# Patient Record
Sex: Female | Born: 2000 | Race: White | Hispanic: No | Marital: Single | Smoking: Never smoker
Health system: Southern US, Community
[De-identification: ages and names within clinical notes are randomized; demographics above are authoritative.]

## PROBLEM LIST (undated history)

## (undated) DIAGNOSIS — J45909 Unspecified asthma, uncomplicated: Secondary | ICD-10-CM

## (undated) DIAGNOSIS — G43909 Migraine, unspecified, not intractable, without status migrainosus: Secondary | ICD-10-CM

## (undated) DIAGNOSIS — M1990 Unspecified osteoarthritis, unspecified site: Secondary | ICD-10-CM

---

## 2019-02-22 ENCOUNTER — Other Ambulatory Visit: Payer: Self-pay

## 2019-02-22 ENCOUNTER — Encounter: Payer: Self-pay | Admitting: Emergency Medicine

## 2019-02-22 ENCOUNTER — Other Ambulatory Visit: Payer: Self-pay | Admitting: *Deleted

## 2019-02-22 ENCOUNTER — Emergency Department
Admission: EM | Admit: 2019-02-22 | Discharge: 2019-02-22 | Disposition: A | Discharge status: Home / Self Care | Payer: BC Managed Care – PPO | Attending: Emergency Medicine | Admitting: Emergency Medicine

## 2019-02-22 ENCOUNTER — Emergency Department: Payer: BC Managed Care – PPO

## 2019-02-22 DIAGNOSIS — B2790 Infectious mononucleosis, unspecified without complication: Secondary | ICD-10-CM | POA: Diagnosis not present

## 2019-02-22 DIAGNOSIS — R070 Pain in throat: Secondary | ICD-10-CM | POA: Diagnosis not present

## 2019-02-22 DIAGNOSIS — Z20822 Contact with and (suspected) exposure to covid-19: Secondary | ICD-10-CM

## 2019-02-22 DIAGNOSIS — R509 Fever, unspecified: Secondary | ICD-10-CM | POA: Diagnosis present

## 2019-02-22 HISTORY — DX: Unspecified osteoarthritis, unspecified site: M19.90

## 2019-02-22 HISTORY — DX: Migraine, unspecified, not intractable, without status migrainosus: G43.909

## 2019-02-22 LAB — GROUP A STREP BY PCR: Group A Strep by PCR: NOT DETECTED

## 2019-02-22 LAB — MONONUCLEOSIS SCREEN: Mono Screen: POSITIVE — AB

## 2019-02-22 NOTE — ED Provider Notes (Signed)
[facility] Emergency Department Provider Note  ____________________________________________   First MD Initiated Contact with Patient 02/22/19 1250     (approximate)  I have reviewed the triage vital signs and the nursing notes.   HISTORY  Chief Complaint Fever and Sore Throat   HPI Carol Parrish is a 18 y.o. female presents to the ED with complaint of fever and sore throat that started yesterday.  Patient states that she was having a Covid swab done at the drive-through here at the ED and they advised her to come to the ED to be evaluated.  Patient states she is unaware of any direct contact with Covid but knows people who has friends who are positive.  Patient denies any difficulty breathing, changes in taste or smell or GI symptoms.  She rates her pain as an 8 out of 10.     Past Medical History:  Diagnosis Date  . Arthritis   . Migraines     There are no active problems to display for this patient.   History reviewed. No pertinent surgical history.  Prior to Admission medications   Not on File    Allergies Amoxicillin  No family history on file.  Social History Social History   Tobacco Use  . Smoking status: Never Smoker  . Smokeless tobacco: Never Used  Substance Use Topics  . Alcohol use: Yes    Comment: occasional  . Drug use: Never    Review of Systems Constitutional: Positive fever/chills Eyes: No visual changes. ENT: Positive sore throat. Cardiovascular: Denies chest pain. Respiratory: Denies shortness of breath. Gastrointestinal: No abdominal pain.  No nausea, no vomiting. Musculoskeletal: Negative for muscle aches. Skin: Negative for rash. Neurological: Negative for headaches, focal weakness or numbness. ____________________________________________   PHYSICAL EXAM:  VITAL SIGNS: ED Triage Vitals  Enc Vitals Group     BP 02/22/19 1232 131/71     Pulse Rate 02/22/19 1232 (!) 139     Resp 02/22/19 1232 17   Temp 02/22/19 1232 (!) 100.9 F (38.3 C)     Temp Source 02/22/19 1232 Oral     SpO2 02/22/19 1232 99 %     Weight 02/22/19 1233 130 lb (59 kg)     Height 02/22/19 1233 5\' 5"  (1.651 m)     Head Circumference --      Peak Flow --      Pain Score 02/22/19 1233 8     Pain Loc --      Pain Edu? --      Excl. in GC? --    Constitutional: Alert and oriented. Well appearing and in no acute distress. Eyes: Conjunctivae are normal.  Head: Atraumatic. Nose: No congestion/rhinnorhea. Mouth/Throat: Mucous membranes are moist.  Oropharynx non-erythematous.  Uvula is midline.  No exudate was noted. Neck: No stridor.   Hematological/Lymphatic/Immunilogical: Bilateral tender cervical lymphadenopathy. Cardiovascular: Normal rate, regular rhythm. Grossly normal heart sounds.  Good peripheral circulation. Respiratory: Normal respiratory effort.  No retractions. Lungs CTAB. Gastrointestinal: Soft and nontender. No distention. No abdominal bruits. No CVA tenderness. Musculoskeletal: Moves upper and lower extremities without difficulty. Neurologic:  Normal speech and language. No gross focal neurologic deficits are appreciated. No gait instability. Skin:  Skin is warm, dry and intact. No rash noted. Psychiatric: Mood and affect are normal. Speech and behavior are normal.  ____________________________________________   LABS (all labs ordered are listed, but only abnormal results are displayed)  Labs Reviewed  MONONUCLEOSIS SCREEN - Abnormal; Notable for the following components:  Result Value   Mono Screen POSITIVE (*)    All other components within normal limits  GROUP A STREP BY PCR    RADIOLOGY   Official radiology report(s): Dg Chest Portable 1 View  Result Date: 02/22/2019 CLINICAL DATA:  Cough and fever for several days EXAM: PORTABLE CHEST 1 VIEW COMPARISON:  None. FINDINGS: The heart size and mediastinal contours are within normal limits. Both lungs are clear. The visualized  skeletal structures are unremarkable. IMPRESSION: No active disease. Electronically Signed   By: Inez Catalina M.D.   On: 02/22/2019 13:46    ____________________________________________   PROCEDURES  Procedure(s) performed (including Critical Care):  Procedures   ____________________________________________   INITIAL IMPRESSION / ASSESSMENT AND PLAN / ED COURSE  As part of my medical decision making, I reviewed the following data within the electronic MEDICAL RECORD NUMBER Notes from prior ED visits and [place] Controlled Substance Database  18 year old female presents to the ED with complaint of fever and sore throat that started yesterday.  Patient was at the [place] testing area here at the hospital run by the health department and it was suggested to her that she come to the ED to be evaluated.  Patient had no direct contact with anyone who was positive for Covid.  Throat was unremarkable however she did have moderate tender cervical lymphadenopathy.  Strep test was negative but mono test was positive.  Patient was also made aware that her chest x-ray was negative.  She was given information about mono and told that she should still quarantine until she has her results of her Covid test.  ____________________________________________   FINAL CLINICAL IMPRESSION(S) / ED DIAGNOSES  Final diagnoses:  Infectious mononucleosis without complication, infectious mononucleosis due to unspecified organism     ED Discharge Orders    None       Note:  This document was prepared using Dragon voice recognition software and may include unintentional dictation errors.    Johnn Hai, PA-C 02/22/19 1554    Carrie Mew, MD 02/22/19 [id number]

## 2019-02-22 NOTE — ED Triage Notes (Signed)
Patient reports fever and sore throat since yesterday afternoon. Was having covid swab done by health dept and they suggested she come to ED to be evaluated.

## 2019-02-22 NOTE — Discharge Instructions (Addendum)
Continue with Tylenol or ibuprofen as needed for fever.  Increase fluids.  Get plenty of rest.  Also you should quarantine until you have heard from your Covid test.  An additional 10 days will be needed to quarantine after you have received the results of your test if it is positive.  Dr.Archinal is the doctor at the [facility].  Contact information was listed on your discharge papers.

## 2019-02-24 LAB — NOVEL CORONAVIRUS, NAA: SARS-CoV-2, NAA: DETECTED — AB

## 2019-05-25 ENCOUNTER — Other Ambulatory Visit: Payer: Self-pay

## 2019-05-25 DIAGNOSIS — N6320 Unspecified lump in the left breast, unspecified quadrant: Secondary | ICD-10-CM

## 2019-06-07 ENCOUNTER — Ambulatory Visit
Admission: RE | Admit: 2019-06-07 | Discharge: 2019-06-07 | Disposition: A | Discharge status: Home / Self Care | Payer: BC Managed Care – PPO | Source: Ambulatory Visit

## 2019-06-07 DIAGNOSIS — N6320 Unspecified lump in the left breast, unspecified quadrant: Secondary | ICD-10-CM

## 2019-06-13 ENCOUNTER — Other Ambulatory Visit: Payer: Self-pay

## 2019-06-13 DIAGNOSIS — N6320 Unspecified lump in the left breast, unspecified quadrant: Secondary | ICD-10-CM

## 2019-12-18 ENCOUNTER — Other Ambulatory Visit: Payer: Self-pay

## 2019-12-18 ENCOUNTER — Ambulatory Visit
Admission: RE | Admit: 2019-12-18 | Discharge: 2019-12-18 | Disposition: A | Discharge status: Home / Self Care | Payer: BC Managed Care – PPO | Source: Ambulatory Visit

## 2019-12-18 DIAGNOSIS — N6320 Unspecified lump in the left breast, unspecified quadrant: Secondary | ICD-10-CM | POA: Diagnosis not present

## 2019-12-22 ENCOUNTER — Other Ambulatory Visit: Payer: BC Managed Care – PPO

## 2020-02-23 ENCOUNTER — Ambulatory Visit
Admission: EM | Admit: 2020-02-23 | Discharge: 2020-02-23 | Disposition: A | Discharge status: Home / Self Care | Payer: BC Managed Care – PPO | Attending: Family Medicine | Admitting: Family Medicine

## 2020-02-23 ENCOUNTER — Encounter: Payer: Self-pay | Admitting: Emergency Medicine

## 2020-02-23 ENCOUNTER — Other Ambulatory Visit: Payer: Self-pay

## 2020-02-23 DIAGNOSIS — R519 Headache, unspecified: Secondary | ICD-10-CM

## 2020-02-23 DIAGNOSIS — R112 Nausea with vomiting, unspecified: Secondary | ICD-10-CM

## 2020-02-23 DIAGNOSIS — K529 Noninfective gastroenteritis and colitis, unspecified: Secondary | ICD-10-CM

## 2020-02-23 HISTORY — DX: Unspecified asthma, uncomplicated: J45.909

## 2020-02-23 MED ORDER — ONDANSETRON 4 MG PO TBDP
4.0000 mg | ORAL_TABLET | Freq: Once | ORAL | Status: AC
  Administered 2020-02-23: 4 mg via ORAL

## 2020-02-23 MED ORDER — ONDANSETRON HCL 4 MG PO TABS: 4.0000 mg | ORAL_TABLET | Freq: Four times a day (QID) | ORAL | 0 refills | Status: AC

## 2020-02-23 MED ORDER — KETOROLAC TROMETHAMINE 30 MG/ML IJ SOLN
30.0000 mg | Freq: Once | INTRAMUSCULAR | Status: AC
  Administered 2020-02-23: 30 mg via INTRAMUSCULAR

## 2020-02-23 NOTE — Discharge Instructions (Addendum)
We gave you Zofran in the office today. You have also received a toradol injection for pain in the office today  I have prescribed zofran for you to take one tablet every 6 hours as needed for nausea  Follow up with this office or with primary care if symptoms are persisting.  Follow up in the ER for high fever, trouble swallowing, trouble breathing, other concerning symptoms.

## 2020-02-23 NOTE — ED Provider Notes (Signed)
[facility]   [id number] 02/23/20 Arrival Time: 1154  CC: ABDOMINAL PAIN  SUBJECTIVE:  Carol Parrish is a 19 y.o. female who presents with complaint of abdominal discomfort that began yesterday. Reports nausea and vomiting since yesterday. Reports headache as well, and states that she cannot keep down ibuprofen or tylenol. Reports that she had a Covid booster vaccine x 2 days ago. Denies these symptoms with previous Covid vaccines. Denies a precipitating event, trauma, close contacts with similar symptoms, recent travel or antibiotic use. Denies abdominal pain. Has not taken OTC medications for this. Denies alleviating or aggravating factors. Denies similar symptoms in the past.   Denies fever, chills, weight changes, nausea, vomiting, chest pain, SOB, diarrhea, constipation, hematochezia, melena, dysuria, difficulty urinating, increased frequency or urgency, flank pain, loss of bowel or bladder function, vaginal discharge, vaginal odor, vaginal bleeding, dyspareunia, pelvic pain.     No LMP recorded (lmp unknown). (Menstrual status: IUD).  ROS: As per HPI.  All other pertinent ROS negative.     Past Medical History:  Diagnosis Date  . Arthritis   . Asthma   . Migraines    History reviewed. No pertinent surgical history. Allergies  Allergen Reactions  . Amoxicillin Other (See Comments)    "passed out"   No current facility-administered medications on file prior to encounter.   No current outpatient medications on file prior to encounter.   Social History   Socioeconomic History  . Marital status: Single    Spouse name: Not on file  . Number of children: Not on file  . Years of education: Not on file  . Highest education level: Not on file  Occupational History  . Not on file  Tobacco Use  . Smoking status: Never Smoker  . Smokeless tobacco: Never Used  Substance and Sexual Activity  . Alcohol use: Yes    Comment: occasional  . Drug use: Never  . Sexual  activity: Not on file  Other Topics Concern  . Not on file  Social History Narrative  . Not on file   Social Determinants of Health   Financial Resource Strain:   . Difficulty of Paying Living Expenses: Not on file  Food Insecurity:   . Worried About Programme researcher, broadcasting/film/video in the Last Year: Not on file  . Ran Out of Food in the Last Year: Not on file  Transportation Needs:   . Lack of Transportation (Medical): Not on file  . Lack of Transportation (Non-Medical): Not on file  Physical Activity:   . Days of Exercise per Week: Not on file  . Minutes of Exercise per Session: Not on file  Stress:   . Feeling of Stress : Not on file  Social Connections:   . Frequency of Communication with Friends and Family: Not on file  . Frequency of Social Gatherings with Friends and Family: Not on file  . Attends Religious Services: Not on file  . Active Member of Clubs or Organizations: Not on file  . Attends Banker Meetings: Not on file  . Marital Status: Not on file  Intimate Partner Violence:   . Fear of Current or Ex-Partner: Not on file  . Emotionally Abused: Not on file  . Physically Abused: Not on file  . Sexually Abused: Not on file   History reviewed. No pertinent family history.   OBJECTIVE:  Vitals:   02/23/20 1257 02/23/20 1300  BP:  119/83  Pulse:  77  Resp:  16  Temp:  97.8 F (36.6 C)  TempSrc:  Oral  SpO2:  98%  Weight: 130 lb (59 kg)   Height: 5\' 5"  (1.651 m)     General appearance: Alert; NAD HEENT: NCAT.  Oropharynx clear.  Lungs: clear to auscultation bilaterally without adventitious breath sounds Heart: regular rate and rhythm.  Radial pulses 2+ symmetrical bilaterally Abdomen: soft, non-distended; normal active bowel sounds; non-tender to light and deep palpation; nontender at McBurney's point; negative Murphy's sign; negative rebound; no guarding Back: no CVA tenderness Extremities: no edema; symmetrical with no gross deformities Skin: warm  and dry Neurologic: normal gait Psychological: alert and cooperative; normal mood and affect  LABS: No results found for this or any previous visit (from the past 24 hour(s)).  DIAGNOSTIC STUDIES: No results found.   ASSESSMENT & PLAN:  1. Gastroenteritis   2. Nausea and vomiting, intractability of vomiting not specified, unspecified vomiting type   3. Nonintractable headache, unspecified chronicity pattern, unspecified headache type     Meds ordered this encounter  Medications  . ketorolac (TORADOL) 30 MG/ML injection 30 mg  . ondansetron (ZOFRAN-ODT) disintegrating tablet 4 mg  . ondansetron (ZOFRAN) 4 MG tablet    Sig: Take 1 tablet (4 mg total) by mouth every 6 (six) hours.    Dispense:  12 tablet    Refill:  0    Order Specific Question:   Supervising Provider    Answer:   Merrilee Jansky    Zofran in office today Toradol 30mg  IM in office today Get rest and drink fluids Zofran prescribed.  Take as directed.    DIET Instructions:  30 minutes after taking nausea medicine, begin with sips of clear liquids. If able to hold down 2 - 4 ounces for 30 minutes, begin drinking more. Increase your fluid intake to replace losses. Clear liquids only for 24 hours (water, tea, sport drinks, clear flat ginger ale or cola and juices, broth, jello, popsicles, ect). Advance to bland foods, applesauce, rice, baked or boiled chicken, ect. Avoid milk, greasy foods and anything that doesn't agree with you.  If you experience new or worsening symptoms return or go to ER such as fever, chills, nausea, vomiting, diarrhea, bloody or dark tarry stools, constipation, urinary symptoms, worsening abdominal discomfort, symptoms that do not improve with medications, inability to keep fluids down.  Reviewed expectations re: course of current medical issues. Questions answered. Outlined signs and symptoms indicating need for more acute intervention. Patient verbalized  understanding. After Visit Summary given.   [id number], NP 02/23/20 1324

## 2020-02-23 NOTE — ED Triage Notes (Signed)
Patient c/o chills and emesis (10 episodes).   Patient stated she received COVID booster on Wednesday and began having chills and N/V.   Patient is unable to keep down food or water. Patient attempted to take NSAID but was unable to keep that down.   Patient stated she had chills with the first vaccine.

## 2021-01-24 ENCOUNTER — Other Ambulatory Visit: Payer: Self-pay

## 2021-01-24 DIAGNOSIS — N6323 Unspecified lump in the left breast, lower outer quadrant: Secondary | ICD-10-CM

## 2021-03-20 ENCOUNTER — Ambulatory Visit
Admission: RE | Admit: 2021-03-20 | Discharge: 2021-03-20 | Disposition: A | Discharge status: Home / Self Care | Payer: BC Managed Care – PPO | Source: Ambulatory Visit

## 2021-03-20 ENCOUNTER — Other Ambulatory Visit: Payer: Self-pay

## 2021-03-20 DIAGNOSIS — N6323 Unspecified lump in the left breast, lower outer quadrant: Secondary | ICD-10-CM | POA: Insufficient documentation

## 2021-07-13 IMAGING — US US BREAST*L* LIMITED INC AXILLA
2 series · 13 of 14 positions shown · non-contrast
Comparison: None.

CLINICAL DATA: 18-year-old female complaining of a palpable
abnormality in the left breast which she says has been stable since
Thursday April, 2019.

EXAM:
ULTRASOUND OF THE LEFT BREAST

[Series 1: us breast*left* limited inc axilla · 0.05mm/px · 8 of 9 slices shown (1 of 2)]
[im 1/9]
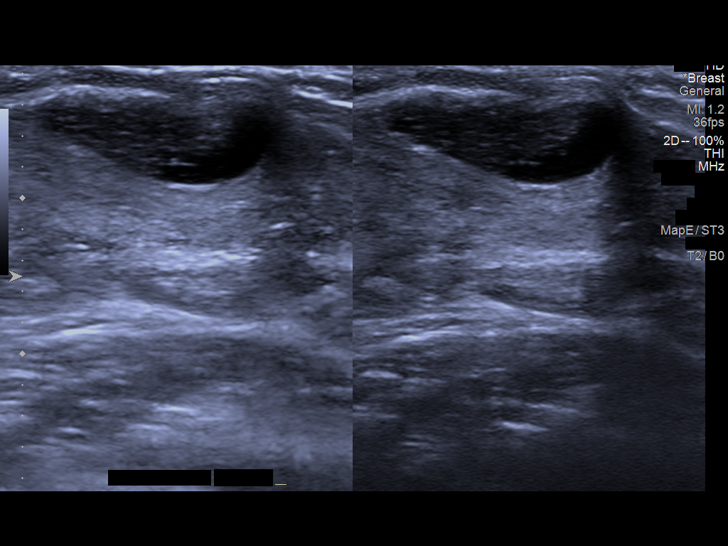
[im 2/9]
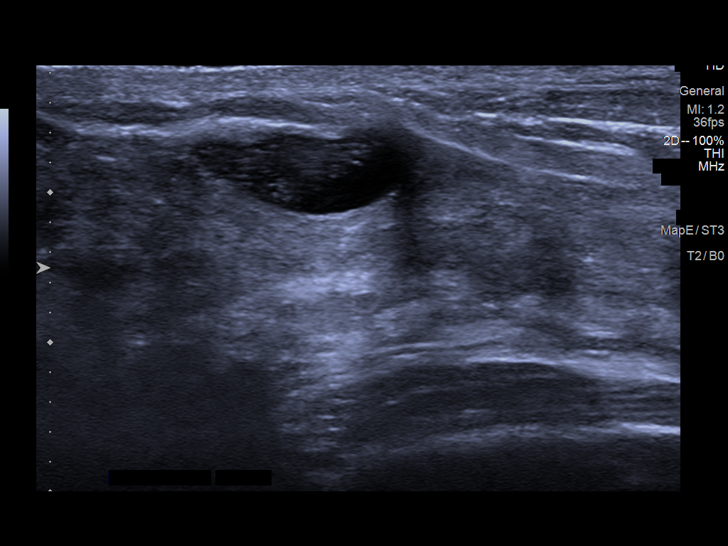
[im 3/9]
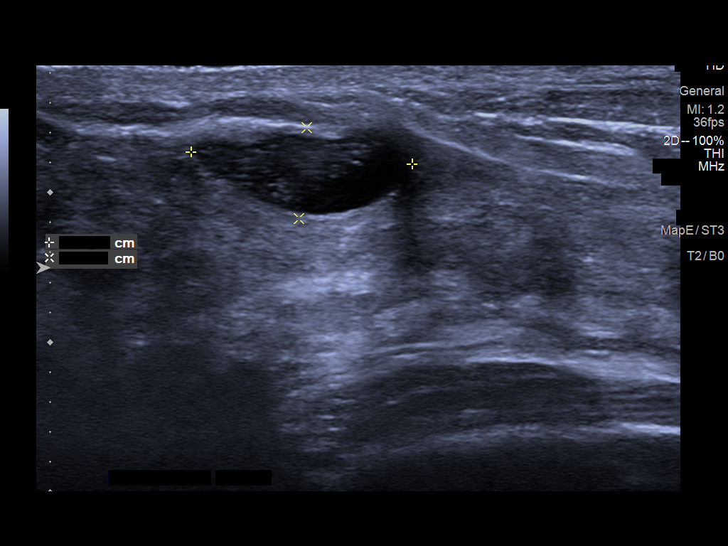
[im 4/9]
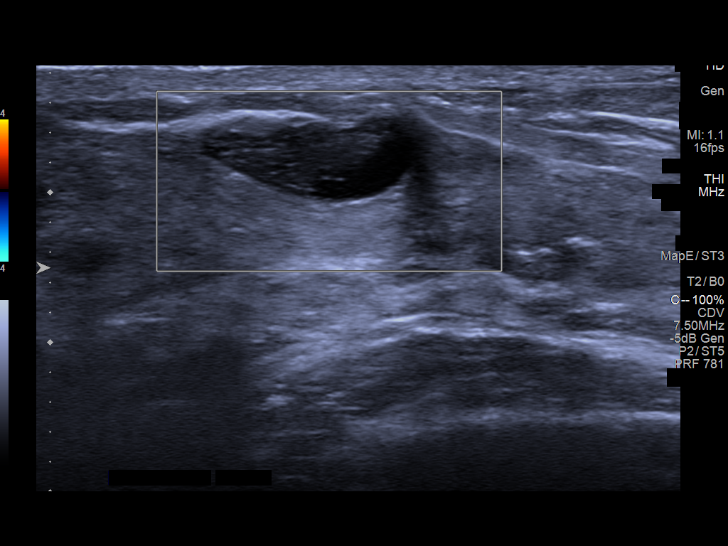
[im 5/9]
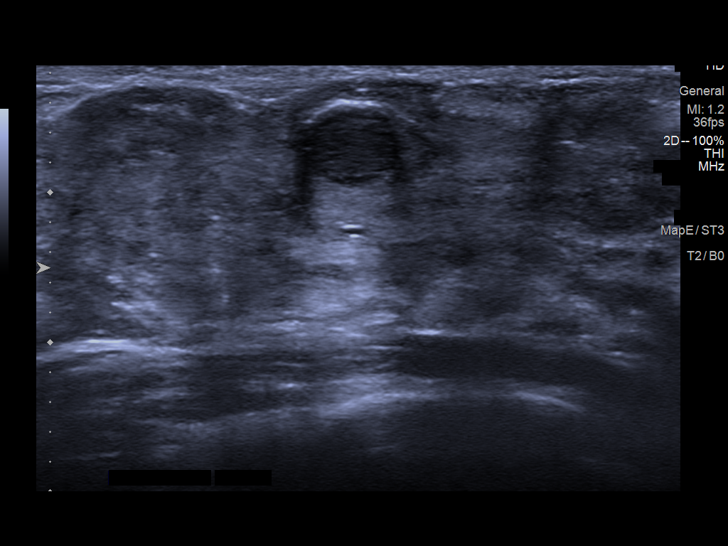
[im 6/9]
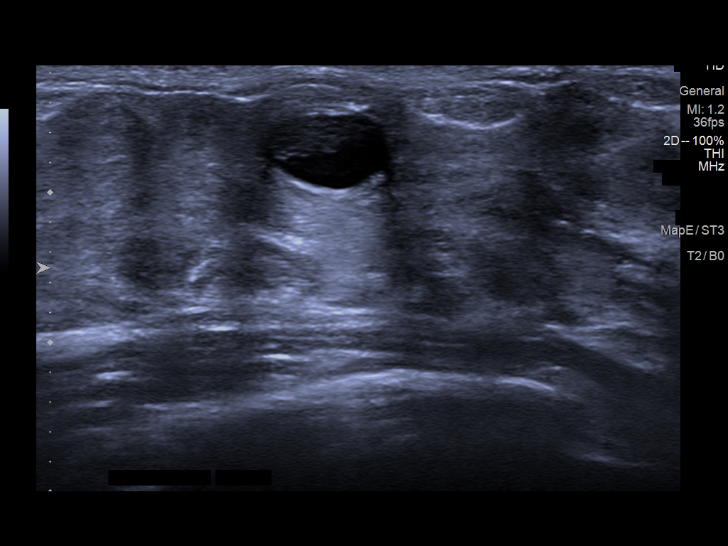
[im 8/9]
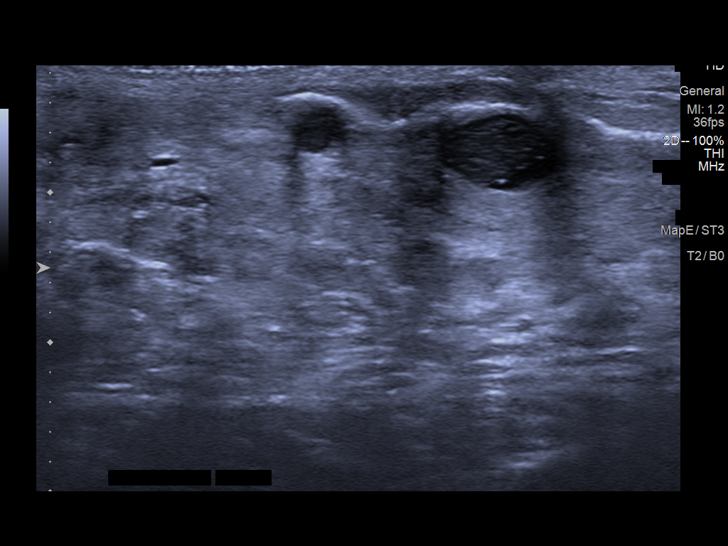
[im 9/9]
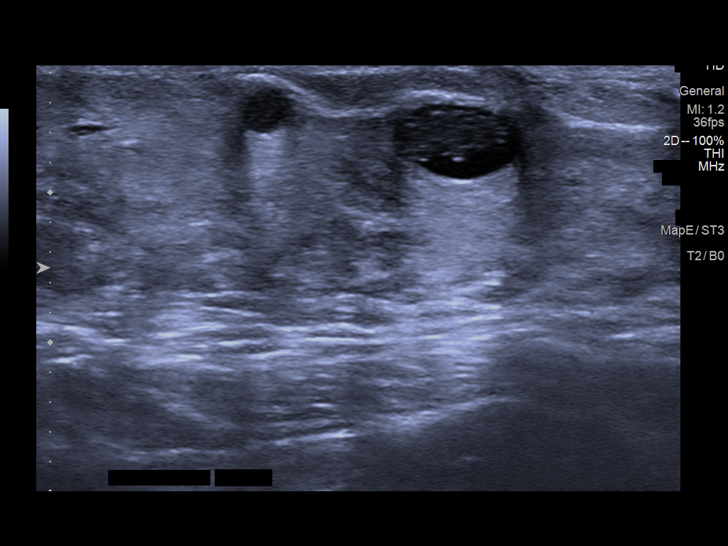

[Series 2: us breast*left* limited inc axilla · 0.05mm/px · 5 of 5 slices shown (2 of 2)]
[im 1/5]
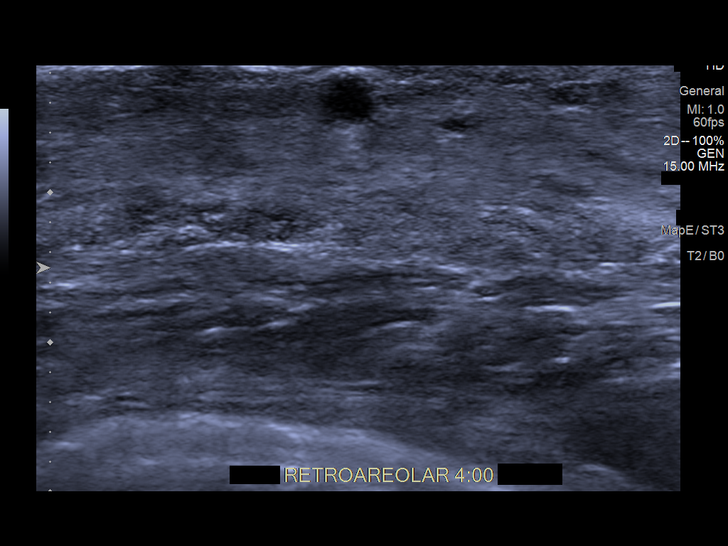
[im 2/5]
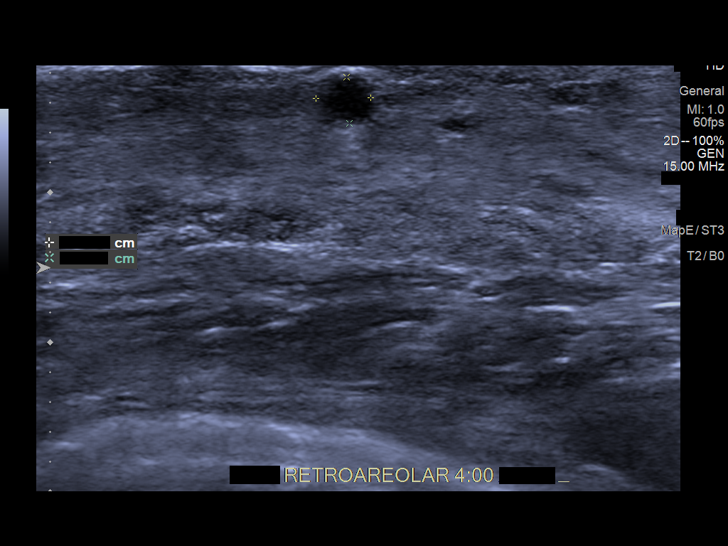
[im 3/5]
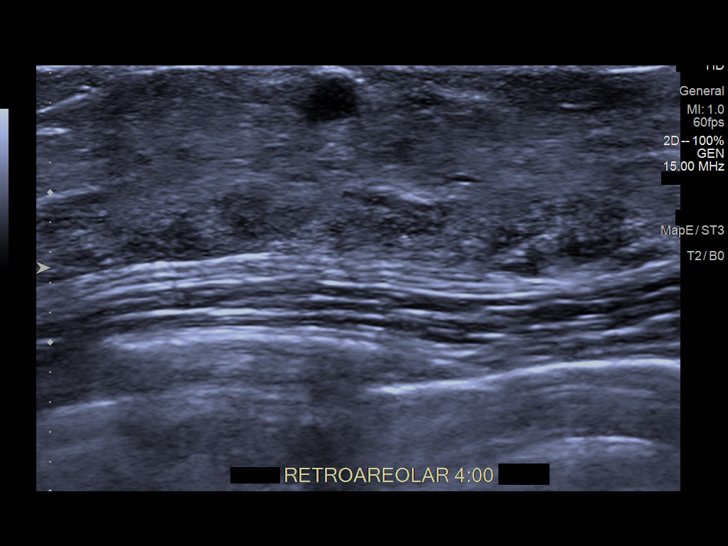
[im 4/5]
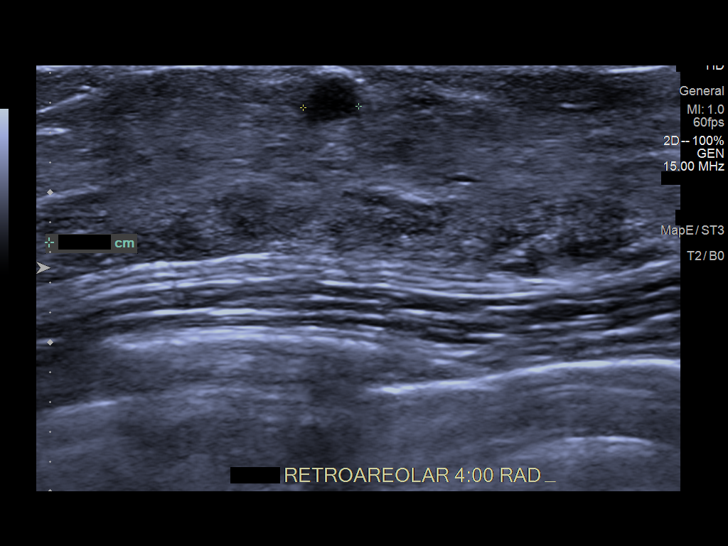
[im 5/5]
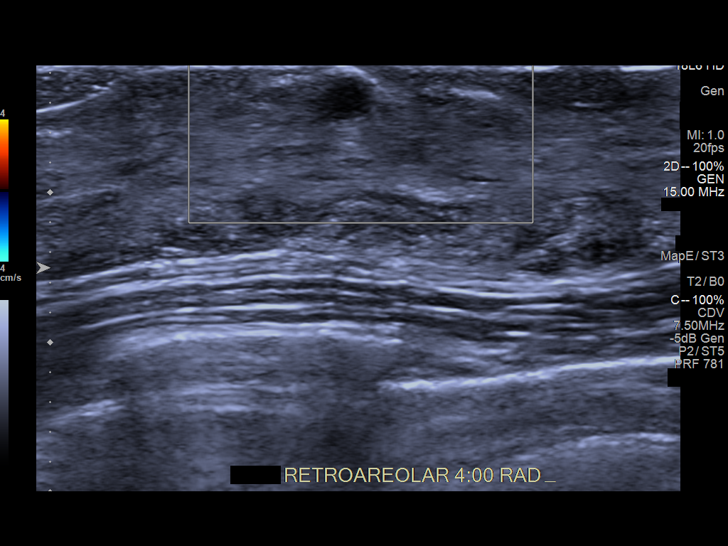

[13 of 14 positions shown; findings below may reference images not displayed]

FINDINGS: On physical exam, I palpate a mass in the left breast at 4 o'clock 2
cm from the nipple.

Targeted ultrasound is performed, showing a well-circumscribed
hypoechoic mass with increased through transmission in the left
breast at 4 o'clock 2 cm from the nipple measuring 1.5 x 0.6 x
cm. There is a cystic component. The mass is felt to likely be
benign and may be a fibroadenoma. In the left breast at 4 o'clock in
the retroareolar region there is a hypoechoic mass with increased
through transmission measuring 4 x 3 x 4 mm. This may be a complex
cyst or fibroadenoma.
IMPRESSION: Probable benign masses in the left breast.

RECOMMENDATION:
Short-term interval follow-up left breast ultrasound in 6 months is
recommended. The importance of self-breast exam in [place] was
discussed with the patient.

I have discussed the findings and recommendations with the patient.
If applicable, a reminder letter will be sent to the patient
regarding the next appointment.

BI-RADS CATEGORY  3: Probably benign.

## 2022-01-23 IMAGING — US US BREAST*L* LIMITED INC AXILLA
1 series · 14 of 18 positions shown · non-contrast
Comparison: Previous exam(s).

CLINICAL DATA: Six-month follow-up for likely benign left masses.

EXAM:
ULTRASOUND OF THE LEFT BREAST

[Series 1: us breast*left* limited inc axilla · 0.05mm/px · 14 of 18 slices shown]
[im 1/18]
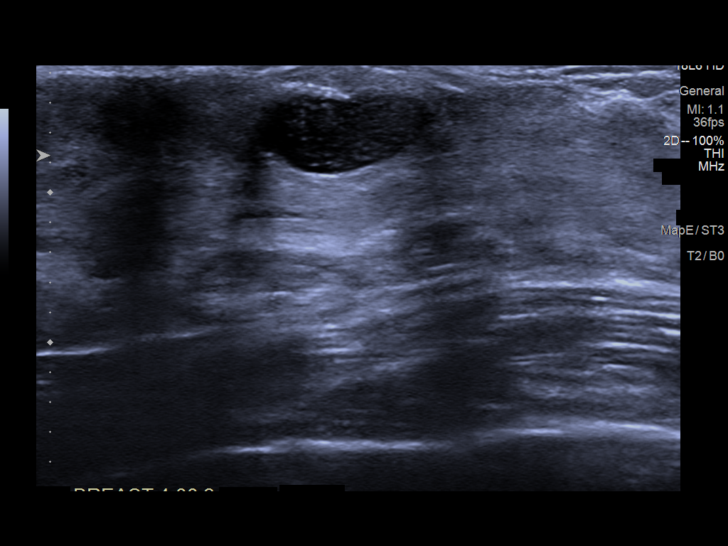
[im 2/18]
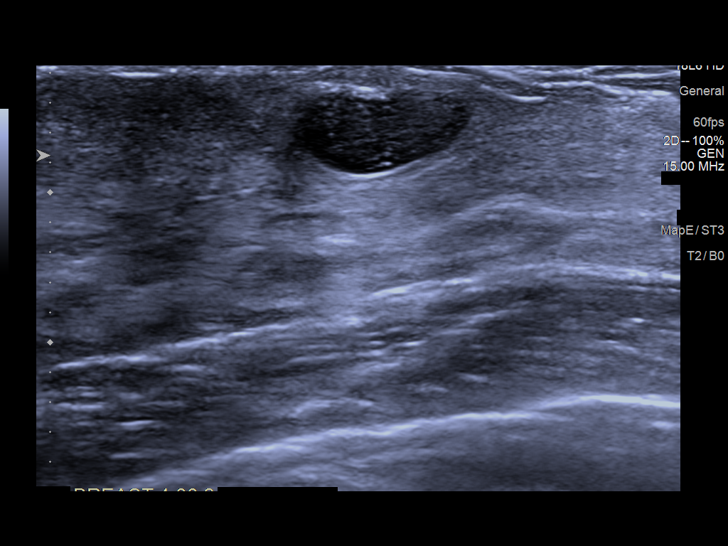
[im 4/18]
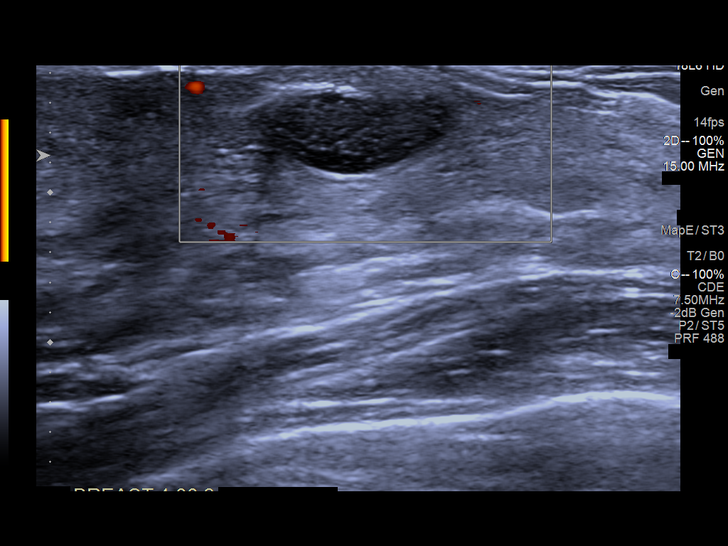
[im 5/18]
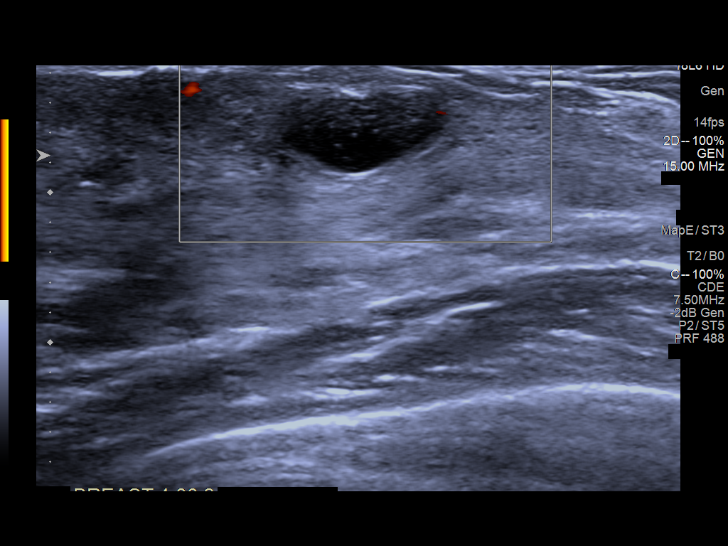
[im 6/18]
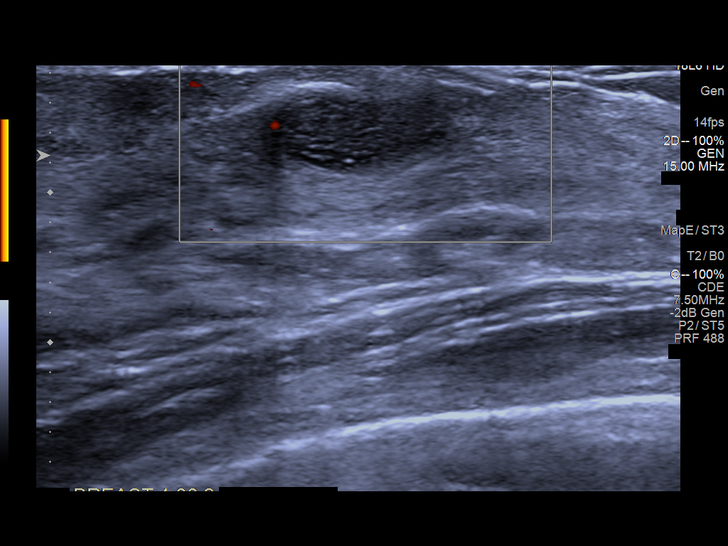
[im 8/18]
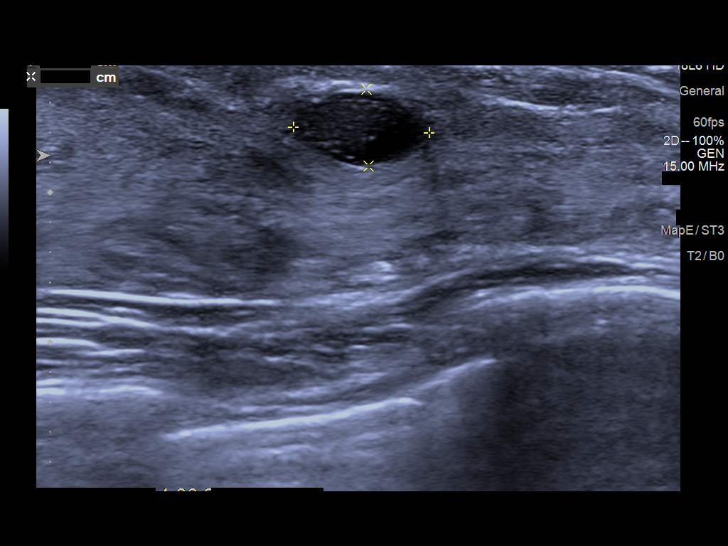
[im 9/18]
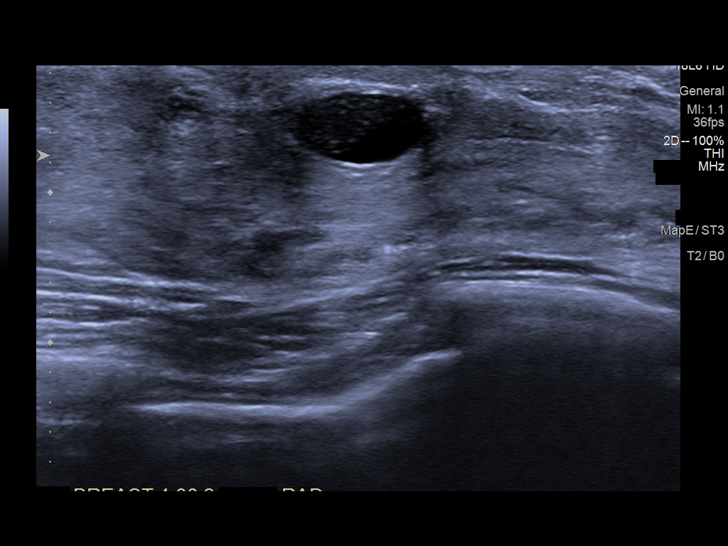
[im 10/18]
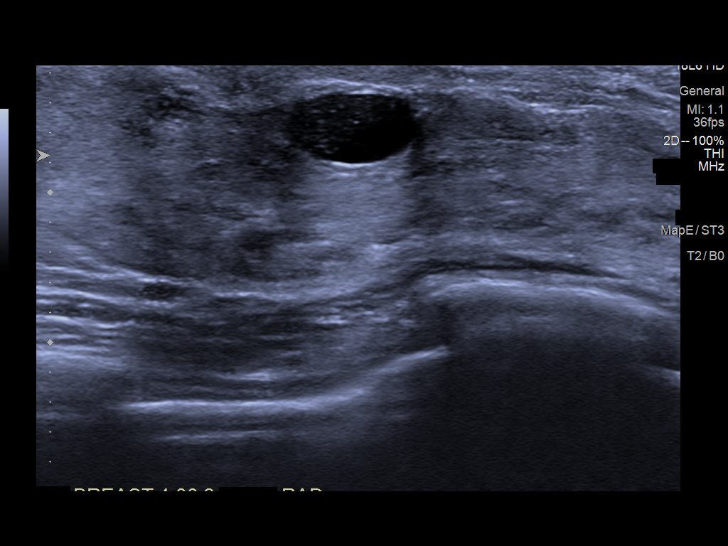
[im 11/18]
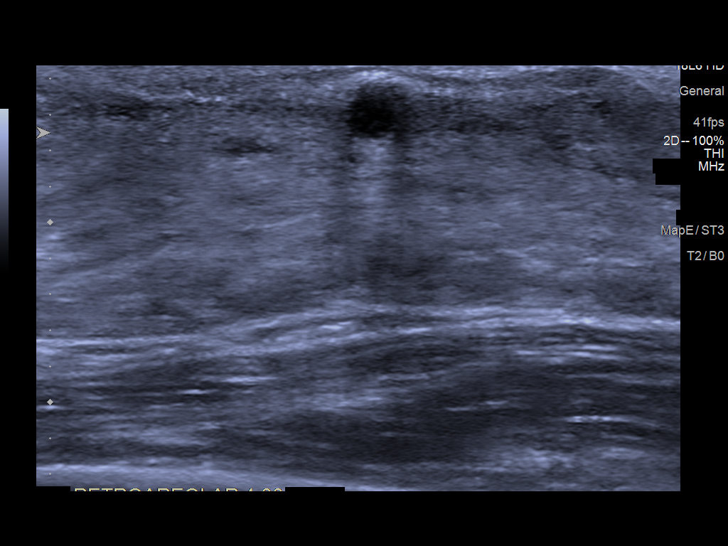
[im 13/18]
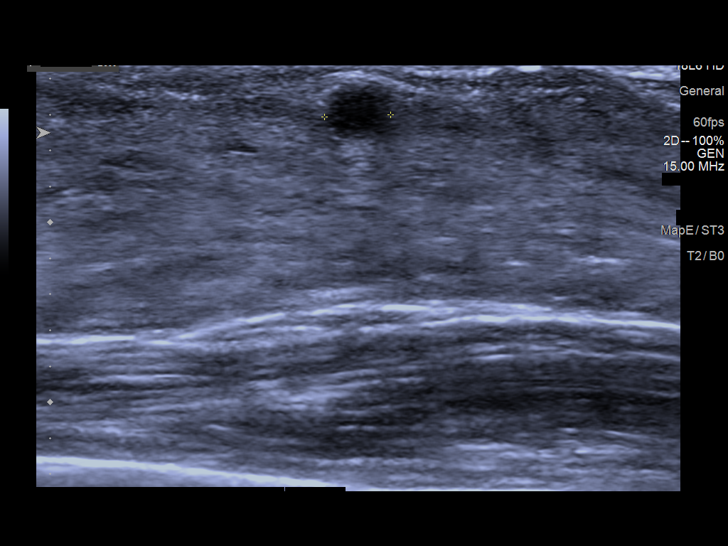
[im 14/18]
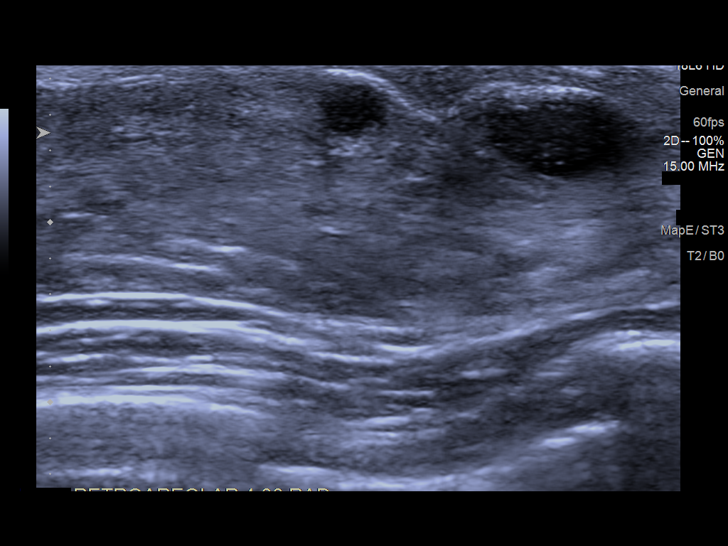
[im 15/18]
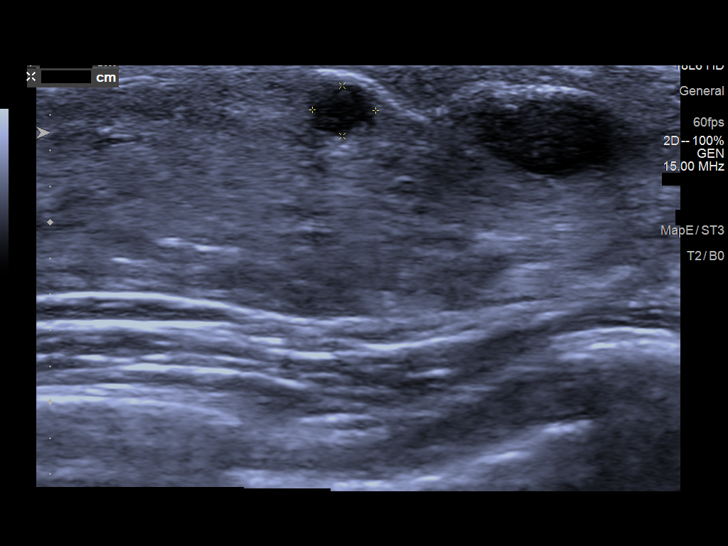
[im 17/18]
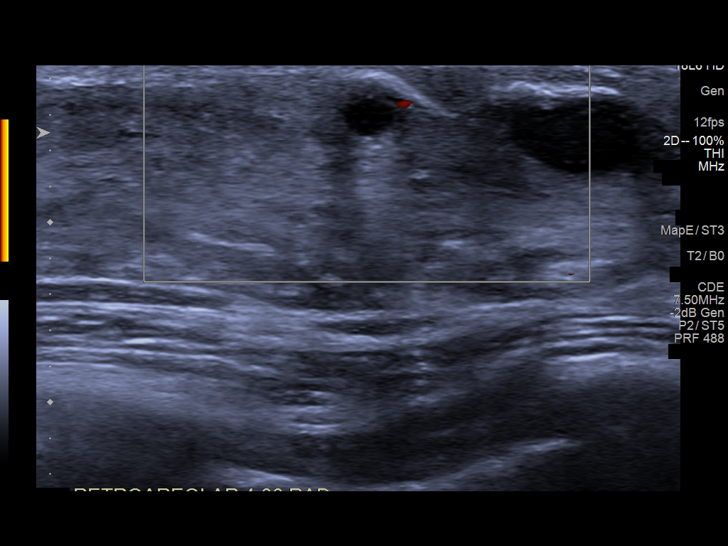
[im 18/18]
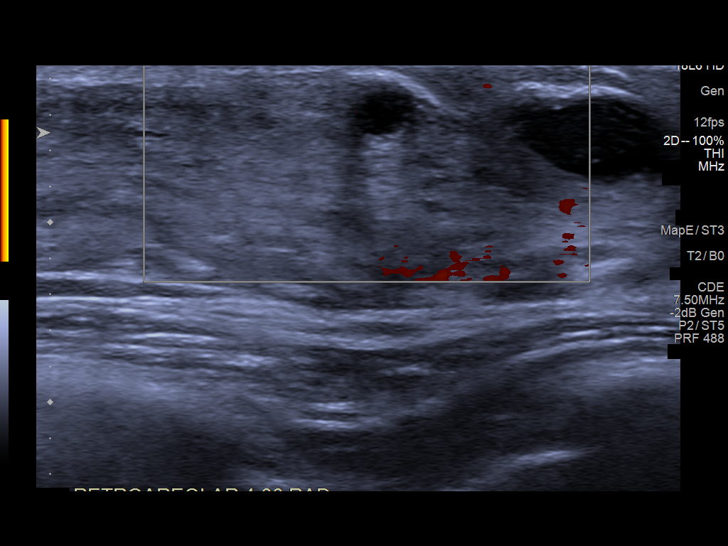

[14 of 18 positions shown; findings below may reference images not displayed]

FINDINGS: Ultrasound targeted to the left breast 4 o'clock, 2 cm from the
nipple demonstrates demonstrates a stable oval hypoechoic mass
measuring 1.2 x 0.5 x 0.9 cm, previously measuring 1.5 x 0.6 x
cm.

There is a smaller adjacent mass in the retroareolar left breast at
4 o'clock measuring 0.4 x 0.3 x 0.4 cm, previously measuring 0.4 x
0.3 x 0.4 cm.
IMPRESSION: The 2 likely benign breast masses are stable.

RECOMMENDATION:
Six-month follow-up left breast ultrasound.

I have discussed the findings and recommendations with the patient.
If applicable, a reminder letter will be sent to the patient
regarding the next appointment.

BI-RADS CATEGORY  3: Probably benign.
